# Patient Record
Sex: Female | Born: 1955 | Race: White | Hispanic: No | Marital: Married | Smoking: Current every day smoker
Health system: Southern US, Community
[De-identification: ages and names within clinical notes are randomized; demographics above are authoritative.]

## PROBLEM LIST (undated history)

## (undated) DIAGNOSIS — R569 Unspecified convulsions: Secondary | ICD-10-CM

## (undated) DIAGNOSIS — E119 Type 2 diabetes mellitus without complications: Secondary | ICD-10-CM

## (undated) DIAGNOSIS — I509 Heart failure, unspecified: Secondary | ICD-10-CM

## (undated) DIAGNOSIS — J449 Chronic obstructive pulmonary disease, unspecified: Secondary | ICD-10-CM

## (undated) DIAGNOSIS — F1020 Alcohol dependence, uncomplicated: Secondary | ICD-10-CM

## (undated) HISTORY — PX: APPENDECTOMY: SHX54

## (undated) HISTORY — PX: ABDOMINAL HYSTERECTOMY: SHX81

---

## 2004-03-06 ENCOUNTER — Other Ambulatory Visit: Payer: Self-pay

## 2004-08-18 ENCOUNTER — Emergency Department: Payer: Self-pay | Admitting: Emergency Medicine

## 2004-09-27 ENCOUNTER — Emergency Department: Payer: Self-pay | Admitting: Internal Medicine

## 2006-04-21 ENCOUNTER — Inpatient Hospital Stay: Payer: Self-pay | Admitting: Internal Medicine

## 2007-10-06 ENCOUNTER — Other Ambulatory Visit: Payer: Self-pay

## 2007-10-06 ENCOUNTER — Inpatient Hospital Stay: Payer: Self-pay | Admitting: Unknown Physician Specialty

## 2016-09-20 ENCOUNTER — Encounter (HOSPITAL_COMMUNITY): Payer: Self-pay

## 2016-09-20 ENCOUNTER — Emergency Department (HOSPITAL_COMMUNITY): Payer: Medicaid Other

## 2016-09-20 ENCOUNTER — Emergency Department (HOSPITAL_COMMUNITY)
Admission: EM | Admit: 2016-09-20 | Discharge: 2016-09-20 | Disposition: A | Discharge status: Home / Self Care | Payer: Medicaid Other | Attending: Emergency Medicine | Admitting: Emergency Medicine

## 2016-09-20 DIAGNOSIS — J449 Chronic obstructive pulmonary disease, unspecified: Secondary | ICD-10-CM | POA: Diagnosis not present

## 2016-09-20 DIAGNOSIS — E119 Type 2 diabetes mellitus without complications: Secondary | ICD-10-CM | POA: Diagnosis not present

## 2016-09-20 DIAGNOSIS — Y999 Unspecified external cause status: Secondary | ICD-10-CM | POA: Insufficient documentation

## 2016-09-20 DIAGNOSIS — F1010 Alcohol abuse, uncomplicated: Secondary | ICD-10-CM | POA: Diagnosis not present

## 2016-09-20 DIAGNOSIS — R569 Unspecified convulsions: Secondary | ICD-10-CM | POA: Diagnosis not present

## 2016-09-20 DIAGNOSIS — Z79899 Other long term (current) drug therapy: Secondary | ICD-10-CM | POA: Insufficient documentation

## 2016-09-20 DIAGNOSIS — W2209XA Striking against other stationary object, initial encounter: Secondary | ICD-10-CM | POA: Diagnosis not present

## 2016-09-20 DIAGNOSIS — Y939 Activity, unspecified: Secondary | ICD-10-CM | POA: Diagnosis not present

## 2016-09-20 DIAGNOSIS — M6281 Muscle weakness (generalized): Secondary | ICD-10-CM | POA: Insufficient documentation

## 2016-09-20 DIAGNOSIS — W19XXXA Unspecified fall, initial encounter: Secondary | ICD-10-CM

## 2016-09-20 DIAGNOSIS — F17200 Nicotine dependence, unspecified, uncomplicated: Secondary | ICD-10-CM | POA: Diagnosis not present

## 2016-09-20 DIAGNOSIS — M549 Dorsalgia, unspecified: Secondary | ICD-10-CM

## 2016-09-20 DIAGNOSIS — Y92009 Unspecified place in unspecified non-institutional (private) residence as the place of occurrence of the external cause: Secondary | ICD-10-CM | POA: Insufficient documentation

## 2016-09-20 DIAGNOSIS — M546 Pain in thoracic spine: Secondary | ICD-10-CM | POA: Diagnosis not present

## 2016-09-20 DIAGNOSIS — S0990XA Unspecified injury of head, initial encounter: Secondary | ICD-10-CM | POA: Insufficient documentation

## 2016-09-20 DIAGNOSIS — R29898 Other symptoms and signs involving the musculoskeletal system: Secondary | ICD-10-CM

## 2016-09-20 DIAGNOSIS — I509 Heart failure, unspecified: Secondary | ICD-10-CM | POA: Diagnosis not present

## 2016-09-20 HISTORY — DX: Alcohol dependence, uncomplicated: F10.20

## 2016-09-20 HISTORY — DX: Chronic obstructive pulmonary disease, unspecified: J44.9

## 2016-09-20 HISTORY — DX: Heart failure, unspecified: I50.9

## 2016-09-20 HISTORY — DX: Type 2 diabetes mellitus without complications: E11.9

## 2016-09-20 HISTORY — DX: Unspecified convulsions: R56.9

## 2016-09-20 LAB — URINALYSIS, ROUTINE W REFLEX MICROSCOPIC
BILIRUBIN URINE: NEGATIVE
Glucose, UA: NEGATIVE mg/dL
KETONES UR: NEGATIVE mg/dL
LEUKOCYTES UA: NEGATIVE
NITRITE: NEGATIVE
Protein, ur: NEGATIVE mg/dL
Specific Gravity, Urine: 1.009 (ref 1.005–1.030)
pH: 7 (ref 5.0–8.0)

## 2016-09-20 LAB — COMPREHENSIVE METABOLIC PANEL
ALBUMIN: 3.6 g/dL (ref 3.5–5.0)
ALT: 24 U/L (ref 14–54)
ANION GAP: 13 (ref 5–15)
AST: 36 U/L (ref 15–41)
Alkaline Phosphatase: 81 U/L (ref 38–126)
BUN: <5 mg/dL — ABNORMAL LOW (ref 6–20)
CO2: 25 mmol/L (ref 22–32)
Calcium: 8.2 mg/dL — ABNORMAL LOW (ref 8.9–10.3)
Chloride: 104 mmol/L (ref 101–111)
Creatinine, Ser: 0.66 mg/dL (ref 0.44–1.00)
GFR calc Af Amer: >60 mL/min (ref >60)
GFR calc non Af Amer: >60 mL/min (ref >60)
GLUCOSE: 98 mg/dL (ref 65–99)
POTASSIUM: 3.3 mmol/L — AB (ref 3.5–5.1)
SODIUM: 142 mmol/L (ref 135–145)
Total Bilirubin: 0.4 mg/dL (ref 0.3–1.2)
Total Protein: 6.5 g/dL (ref 6.5–8.1)

## 2016-09-20 LAB — CBC WITH DIFFERENTIAL/PLATELET
BASOS PCT: 2 %
Basophils Absolute: 0.1 10*3/uL (ref 0.0–0.1)
EOS ABS: 0.3 10*3/uL (ref 0.0–0.7)
Eosinophils Relative: 5 %
HCT: 39.5 % (ref 36.0–46.0)
HEMOGLOBIN: 13 g/dL (ref 12.0–15.0)
Lymphocytes Relative: 56 %
Lymphs Abs: 3.6 10*3/uL (ref 0.7–4.0)
MCH: 33.9 pg (ref 26.0–34.0)
MCHC: 32.9 g/dL (ref 30.0–36.0)
MCV: 102.9 fL — ABNORMAL HIGH (ref 78.0–100.0)
MONO ABS: 0.5 10*3/uL (ref 0.1–1.0)
Monocytes Relative: 8 %
NEUTROS PCT: 29 %
Neutro Abs: 1.8 10*3/uL (ref 1.7–7.7)
Platelets: 262 10*3/uL (ref 150–400)
RBC: 3.84 MIL/uL — ABNORMAL LOW (ref 3.87–5.11)
RDW: 14.5 % (ref 11.5–15.5)
WBC: 6.4 10*3/uL (ref 4.0–10.5)

## 2016-09-20 LAB — RAPID URINE DRUG SCREEN, HOSP PERFORMED
Amphetamines: NOT DETECTED
BARBITURATES: NOT DETECTED
Benzodiazepines: NOT DETECTED
Cocaine: POSITIVE — AB
OPIATES: NOT DETECTED
Tetrahydrocannabinol: NOT DETECTED

## 2016-09-20 LAB — ETHANOL: Alcohol, Ethyl (B): 193 mg/dL — ABNORMAL HIGH (ref <5)

## 2016-09-20 MED ORDER — ONDANSETRON HCL 4 MG/2ML IJ SOLN
4.0000 mg | Freq: Once | INTRAMUSCULAR | Status: AC
  Administered 2016-09-20: 4 mg via INTRAVENOUS
  Filled 2016-09-20: qty 2

## 2016-09-20 MED ORDER — MIDAZOLAM HCL 2 MG/2ML IJ SOLN
1.0000 mg | Freq: Once | INTRAMUSCULAR | Status: AC
  Administered 2016-09-20: 1 mg via INTRAVENOUS

## 2016-09-20 MED ORDER — MIDAZOLAM HCL 2 MG/2ML IJ SOLN
INTRAMUSCULAR | Status: AC
  Filled 2016-09-20: qty 2

## 2016-09-20 MED ORDER — FENTANYL CITRATE (PF) 100 MCG/2ML IJ SOLN: 50.0000 ug | INTRAMUSCULAR | Status: DC | PRN

## 2016-09-20 MED ORDER — PHENYTOIN SODIUM EXTENDED 100 MG PO CAPS: 300.0000 mg | ORAL_CAPSULE | Freq: Once | ORAL | Status: DC

## 2016-09-20 MED ORDER — SODIUM CHLORIDE 0.9 % IV SOLN
250.0000 mg | Freq: Once | INTRAVENOUS | Status: AC
  Administered 2016-09-20: 250 mg via INTRAVENOUS
  Filled 2016-09-20: qty 5

## 2016-09-20 MED ORDER — SODIUM CHLORIDE 0.9 % IV SOLN
INTRAVENOUS | Status: DC
  Administered 2016-09-20: 05:00:00 via INTRAVENOUS

## 2016-09-20 MED ORDER — ONDANSETRON HCL 4 MG/2ML IJ SOLN: 4.0000 mg | Freq: Four times a day (QID) | INTRAMUSCULAR | Status: DC | PRN

## 2016-09-20 MED ORDER — FENTANYL CITRATE (PF) 100 MCG/2ML IJ SOLN
50.0000 ug | Freq: Once | INTRAMUSCULAR | Status: AC
  Administered 2016-09-20: 50 ug via INTRAVENOUS
  Filled 2016-09-20: qty 2

## 2016-09-20 MED ORDER — LORAZEPAM 2 MG/ML IJ SOLN
1.0000 mg | Freq: Once | INTRAMUSCULAR | Status: AC
  Administered 2016-09-20: 1 mg via INTRAVENOUS
  Filled 2016-09-20: qty 1

## 2016-09-20 NOTE — ED Provider Notes (Signed)
TIME SEEN: 4:40 AM  CHIEF COMPLAINT: fall, back pain, bilateral lower extremity weakness   HPI: Patient is a 61 year old female with history of COPD on chronic oxygen, seizures no longer on Dilantin, alcohol abuse, diabetes who presents emergency department [facility] for bilateral lower extremity weakness, numbness after a fall. Patient reports she was wringing alcohol the night and states "I drank way too much". She reports that someone told her that she had a seizure at home and struck her head on the door. Has history of tonic-clonic seizures not in the setting of withdrawal and states that she has not had her Dilantin in over a year because she has not had a seizure in over one year. Is complaining of neck, thoracic and lumbar spine pain. Reports she is wheelchair-bound at baseline but is because of chronic shortness of breath. 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 Normally is able to move her legs and feel her legs without difficulty. Before this seizure, states she was feeling well without complaints. At outside hospital, patient found to have alcohol level of 350. Had a CT of her head, cervical spine, thoracic spine and lumbar spine showed no acute abnormality. Sent here for MRI. Outside hospital reported decreased rectal tone.  ROS: See HPI Constitutional: no fever  Eyes: no drainage  ENT: no runny nose   Cardiovascular:  no chest pain  Resp: no SOB  GI: no vomiting GU: no dysuria Integumentary: no rash  Allergy: no hives  Musculoskeletal: no leg swelling  Neurological: no slurred speech ROS otherwise negative  PAST MEDICAL HISTORY/PAST SURGICAL HISTORY:  Past Medical History:  Diagnosis Date  . Alcoholism (HCC)   . CHF (congestive heart failure) (HCC)   . COPD (chronic obstructive pulmonary disease) (HCC)   . Diabetes mellitus without complication (HCC)   . Seizures (HCC)      MEDICATIONS:  Prior to Admission medications   Not on File    ALLERGIES:  Allergies  Allergen Reactions  . Demerol [Meperidine] Anaphylaxis  . Ivp Dye [Iodinated Diagnostic Agents] Anaphylaxis  . Toradol [Ketorolac Tromethamine] Other (See Comments)    "Makes me have seizures"   . Tramadol Other (See Comments)    "Makes me have seizures"   . Codeine Rash  . Penicillins Rash    SOCIAL HISTORY:  Social History  Substance Use Topics  . Smoking status: Current Every Day Smoker  . Smokeless tobacco: Never Used  . Alcohol use Yes     Comment: daily    FAMILY HISTORY: No family history on file.  EXAM: BP 153/79   Pulse 82   Temp 98.5 F (36.9 C) (Oral)   Resp 18   Ht 5\' 2"  (1.575 m)   Wt 200 lb (90.7 kg)   SpO2 98%   BMI 36.58 kg/m  CONSTITUTIONAL: Alert and oriented and responds appropriately to questions.  Obese, does not appear significantly intoxicated, in no distress, chronically ill-appearing HEAD: Normocephalic, atraumatic EYES: Conjunctivae mildly injected bilaterally, PERRL, EOMI ENT: normal nose; no rhinorrhea; moist mucous membranes, no dental injury, no tenderness over the face NECK: Supple, no meningismus, no nuchal rigidity, no LAD, patient does have diffuse cervical spine tenderness without step-off or deformity, an Aspen collar  CARD: RRR; S1 and S2 appreciated; no murmurs, no clicks, no rubs, no gallops CHEST:  Chest wall nontender to palpation without crepitus, ecchymosis or deformity RESP: Normal chest excursion without splinting or tachypnea; breath sounds clear  and equal bilaterally; no wheezes, no rhonchi, no rales, no hypoxia or respiratory distress, speaking full sentences ABD/GI: Normal bowel sounds; non-distended; soft, non-tender, no rebound, no guarding, no peritoneal signs, no hepatosplenomegaly BACK:  The back appears normal but is tender over the mid thoracic region and upper lumbar region with point tenderness over T7, T8, L1, L2  without step-off or deformity EXT:  non-tender to palpation; no edema; normal capillary refill; no cyanosis, no calf tenderness or swelling; no bony deformity 2+ radial DPs pulses bilaterally, compartment soft, no joint effusion SKIN: Normal color for age and race; warm; no rash NEURO:  Patient is able to move her bilateral upper extreme is normally with 5/5 strength. She has normal reflexes in the bilateral upper extremities with normal sensation throughout her torso, upper shoulders and face. Cranial nerves II through XII intact. Patient has absolutely no movement and no sensation in the bilateral lower extremity from the hip down. Completely diminished reflexes in bilateral lower extremities. Does not have a sensation the pain. Of the lower extremities. PSYCH: The patient's mood and manner are appropriate. Grooming and personal hygiene are appropriate.  MEDICAL DECISION MAKING: Patient here with concerns for bilateral lower extremity weakness and numbness after a seizure, head injury that occurred yesterday evening. Outside hospital CT imaging showed no acute abnormality. Sent here for MRI. Here patient exam is very concerning where she has decreased sensation to light stimulus and even painful stimuli to her bilateral lower extremities with absolutely no movement. There is stool on her bed sheets and outside hospital reported decreased rectal tone. Doubt this is a CVA given his bilateral and she is mentating normally. She does not appear significantly intoxicated at this time. We'll repeat her labs today including an ethanol level. We'll discuss with neurology but I feel she will need emergent MRIs of her lumbar and thoracic spine and possibly of her cervical spine as well.  ED PROGRESS:   5:00 AM  D/w Dr. Amada Jupiter with neurology.  Appreciate his help. He agrees that patient needs MRIs of her cervical, thoracic and lumbar spine without contrast. He recommends starting patient back on Dilantin 300  mg daily at bedtime. We'll give first dose in the emergency department.   6:45 AM  Pt going to MRI. We have placed a Foley catheter given at this time she is on spine precautions and I do not want her on a bedpan. Still in a cervical collar. We'll give Ativan to help her with her anxiety for the MRI. She is NPO and receiving gentle IV hydration. Patient will be signed out to oncoming provider to follow up on MRI results.   I reviewed all nursing notes, vitals, pertinent old records, EKGs, labs, imaging (as available).    Layla Maw Ward, DO 09/20/16 [phone]

## 2016-09-20 NOTE — ED Triage Notes (Addendum)
PT arrives via ems [facility] for MRI and evaluation of inability to feel or move bilateral lower extremities. PT was at home tonight when she had unwitnessed seizure and fell, hitting head on door. PT complains of 10/10 thoracic lumbar pain but states she cant feel her legs. ETOH 350. Pt arrives to ed sitting up on stretcher with C-collar in place but reports prior hospital [facility]) removed collar and allowed her to sit up and drink water.

## 2016-09-20 NOTE — ED Notes (Signed)
[facility] reports pt had no rectal tone on exam

## 2016-09-20 NOTE — ED Notes (Signed)
Patient transported to MRI 

## 2016-09-20 NOTE — ED Notes (Signed)
Pt collar changed to Aspen collar and HOB flat. Pt tolerating well but complaining of back pain. Dr. Elesa MassedWard made aware

## 2016-09-20 NOTE — ED Provider Notes (Signed)
Mr Cervical Spine Wo Contrast  Result Date: 09/20/2016 CLINICAL DATA:  Bilateral lower extremity weakness and decreased rectal tone after a fall. Cervical, thoracic and lumbar spine pain. EXAM: MRI CERVICAL, THORACIC AND LUMBAR SPINE WITHOUT CONTRAST TECHNIQUE: Multiplanar and multiecho pulse sequences of the cervical spine, to include the craniocervical junction and cervicothoracic junction, and thoracic and lumbar spine, were obtained without intravenous contrast. COMPARISON:  None. FINDINGS: MRI CERVICAL SPINE FINDINGS Alignment: Physiologic. Vertebrae: No fracture, evidence of discitis, or bone lesion. Cord: Normal signal and morphology. Motion degrades image quality and sensitivity. Paraspinal and other soft tissues: Negative. Disc levels: Craniocervical junction through C4-5: Negative. C5-6: Tiny broad-based disc osteophyte complex without focal neural impingement. C6-7 through C7-T1: No significant abnormality. MRI THORACIC SPINE FINDINGS Alignment:  Physiologic. Vertebrae: No fracture, evidence of discitis, or significant bone lesion. Focal fat in the inferior aspect of T3, not significant. Cord:  Normal signal and morphology. Paraspinal and other soft tissues: Negative. Disc levels: No disc bulging or protrusion or other abnormality of the discs. MRI LUMBAR SPINE FINDINGS Segmentation:  Standard. Alignment:  Physiologic. Vertebrae:  No fracture, evidence of discitis, or bone lesion. Conus medullaris: Extends to the T12-L1 level and appears normal. Paraspinal and other soft tissues: Negative. Disc levels: L1-2 and L2-3: Slight disc desiccation. No disc bulging or protrusion. L3-4: Slight disc desiccation. Small right extraforaminal disc bulge touches the right L3 nerve lateral to the neural foramen. Slight hypertrophy of the facet joints. L4-5: Minimal disc bulges into the neural foramina. Moderate hypertrophy of the ligamentum flavum. No neural impingement. L5-S1: Normal disc. Minimal degenerative changes  of the facet joints. IMPRESSION: 1. No significant abnormality of the cervical spine. 2. No significant abnormality of the thoracic spine. 3. No significant abnormality of the lumbar spine. Electronically Signed   By: Francene Boyers M.D.   On: 09/20/2016 09:06   Mr Thoracic Spine Wo Contrast  Result Date: 09/20/2016 CLINICAL DATA:  Bilateral lower extremity weakness and decreased rectal tone after a fall. Cervical, thoracic and lumbar spine pain. EXAM: MRI CERVICAL, THORACIC AND LUMBAR SPINE WITHOUT CONTRAST TECHNIQUE: Multiplanar and multiecho pulse sequences of the cervical spine, to include the craniocervical junction and cervicothoracic junction, and thoracic and lumbar spine, were obtained without intravenous contrast. COMPARISON:  None. FINDINGS: MRI CERVICAL SPINE FINDINGS Alignment: Physiologic. Vertebrae: No fracture, evidence of discitis, or bone lesion. Cord: Normal signal and morphology. Motion degrades image quality and sensitivity. Paraspinal and other soft tissues: Negative. Disc levels: Craniocervical junction through C4-5: Negative. C5-6: Tiny broad-based disc osteophyte complex without focal neural impingement. C6-7 through C7-T1: No significant abnormality. MRI THORACIC SPINE FINDINGS Alignment:  Physiologic. Vertebrae: No fracture, evidence of discitis, or significant bone lesion. Focal fat in the inferior aspect of T3, not significant. Cord:  Normal signal and morphology. Paraspinal and other soft tissues: Negative. Disc levels: No disc bulging or protrusion or other abnormality of the discs. MRI LUMBAR SPINE FINDINGS Segmentation:  Standard. Alignment:  Physiologic. Vertebrae:  No fracture, evidence of discitis, or bone lesion. Conus medullaris: Extends to the T12-L1 level and appears normal. Paraspinal and other soft tissues: Negative. Disc levels: L1-2 and L2-3: Slight disc desiccation. No disc bulging or protrusion. L3-4: Slight disc desiccation. Small right extraforaminal disc bulge  touches the right L3 nerve lateral to the neural foramen. Slight hypertrophy of the facet joints. L4-5: Minimal disc bulges into the neural foramina. Moderate hypertrophy of the ligamentum flavum. No neural impingement. L5-S1: Normal disc. Minimal degenerative changes of the facet joints. IMPRESSION:  1. No significant abnormality of the cervical spine. 2. No significant abnormality of the thoracic spine. 3. No significant abnormality of the lumbar spine. Electronically Signed   By: Francene BoyersJames  Maxwell M.D.   On: 09/20/2016 09:06   Mr Lumbar Spine Wo Contrast  Result Date: 09/20/2016 CLINICAL DATA:  Bilateral lower extremity weakness and decreased rectal tone after a fall. Cervical, thoracic and lumbar spine pain. EXAM: MRI CERVICAL, THORACIC AND LUMBAR SPINE WITHOUT CONTRAST TECHNIQUE: Multiplanar and multiecho pulse sequences of the cervical spine, to include the craniocervical junction and cervicothoracic junction, and thoracic and lumbar spine, were obtained without intravenous contrast. COMPARISON:  None. FINDINGS: MRI CERVICAL SPINE FINDINGS Alignment: Physiologic. Vertebrae: No fracture, evidence of discitis, or bone lesion. Cord: Normal signal and morphology. Motion degrades image quality and sensitivity. Paraspinal and other soft tissues: Negative. Disc levels: Craniocervical junction through C4-5: Negative. C5-6: Tiny broad-based disc osteophyte complex without focal neural impingement. C6-7 through C7-T1: No significant abnormality. MRI THORACIC SPINE FINDINGS Alignment:  Physiologic. Vertebrae: No fracture, evidence of discitis, or significant bone lesion. Focal fat in the inferior aspect of T3, not significant. Cord:  Normal signal and morphology. Paraspinal and other soft tissues: Negative. Disc levels: No disc bulging or protrusion or other abnormality of the discs. MRI LUMBAR SPINE FINDINGS Segmentation:  Standard. Alignment:  Physiologic. Vertebrae:  No fracture, evidence of discitis, or bone lesion.  Conus medullaris: Extends to the T12-L1 level and appears normal. Paraspinal and other soft tissues: Negative. Disc levels: L1-2 and L2-3: Slight disc desiccation. No disc bulging or protrusion. L3-4: Slight disc desiccation. Small right extraforaminal disc bulge touches the right L3 nerve lateral to the neural foramen. Slight hypertrophy of the facet joints. L4-5: Minimal disc bulges into the neural foramina. Moderate hypertrophy of the ligamentum flavum. No neural impingement. L5-S1: Normal disc. Minimal degenerative changes of the facet joints. IMPRESSION: 1. No significant abnormality of the cervical spine. 2. No significant abnormality of the thoracic spine. 3. No significant abnormality of the lumbar spine. Electronically Signed   By: Francene BoyersJames  Maxwell M.D.   On: 09/20/2016 09:06     MRI complete spine without abnormality.  Patient feels much better at this time.  She is ambulatory in the emergency department.  Discharge home in good condition. ETOH intoxication   Azalia BilisKevin Oleg Oleson, MD 09/20/16 1052

## 2018-02-11 IMAGING — MR MR LUMBAR SPINE W/O CM
13 of 17 series · 37 of 48 positions shown · non-contrast
Comparison: None.

CLINICAL DATA: Bilateral lower extremity weakness and decreased
rectal tone after a fall. Cervical, thoracic and lumbar spine pain.

EXAM:
MRI CERVICAL, THORACIC AND LUMBAR SPINE WITHOUT CONTRAST
TECHNIQUE: Multiplanar and multiecho pulse sequences of the cervical spine, to
include the craniocervical junction and cervicothoracic junction,
and thoracic and lumbar spine, were obtained without intravenous
contrast.

[Series 2: T2 · sagittal · 3.0mm · 0.43mm/px · 1 of 14 slices shown (1 of 7)]
[im 1/14]
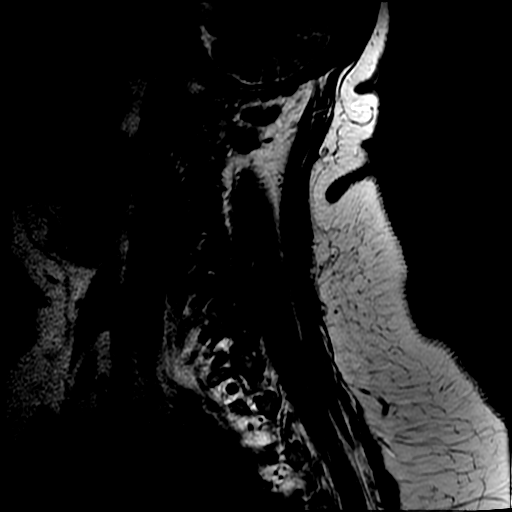

[Series 3: STIR · sagittal · 3.0mm · 0.43mm/px · 1 of 14 slices shown]
[im 1/14]
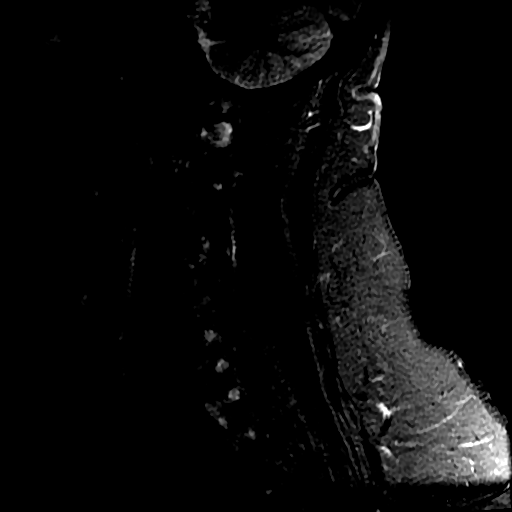

[Series 7: T2 · axial · 3.0mm · 0.70mm/px · z∈[-48,+56]mm · 5 of 33 slices shown (2 of 7)]
[im 1/33]
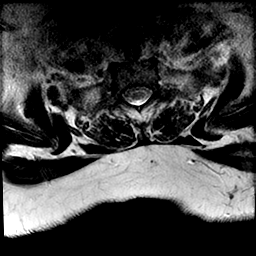
[im 9/33]
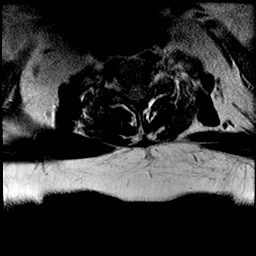
[im 17/33]
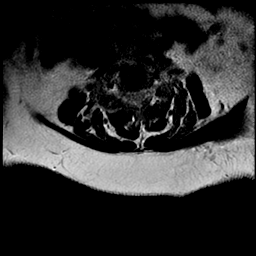
[im 25/33]
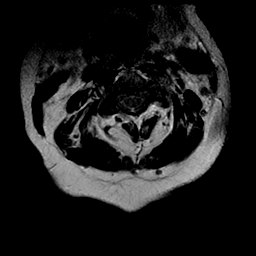
[im 33/33]
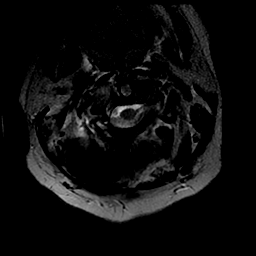

[Series 10: T1 · sagittal · 3.0mm · 0.43mm/px · 2 of 14 slices shown (1 of 5)]
[im 1/14]
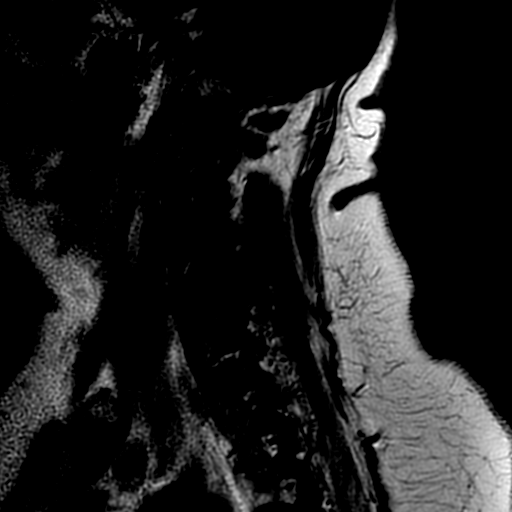
[im 14/14]
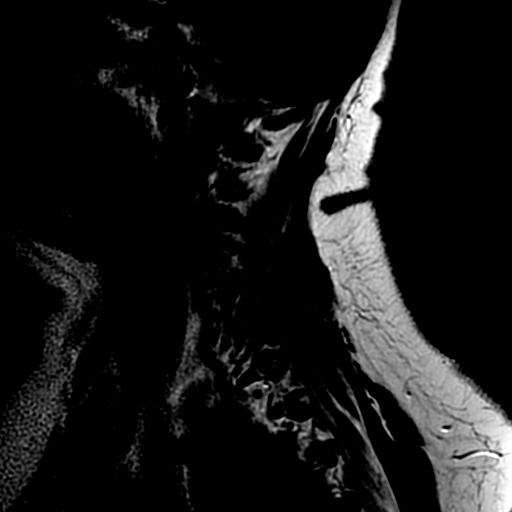

[Series 12: T1 · sagittal · 3.0mm · 0.90mm/px · 2 of 12 slices shown (2 of 5)]
[im 1/12]
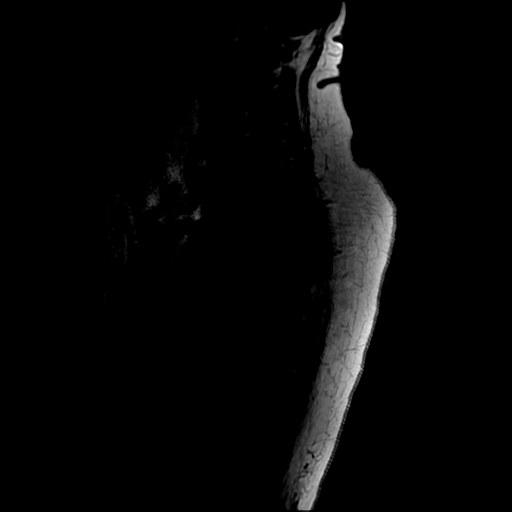
[im 12/12]
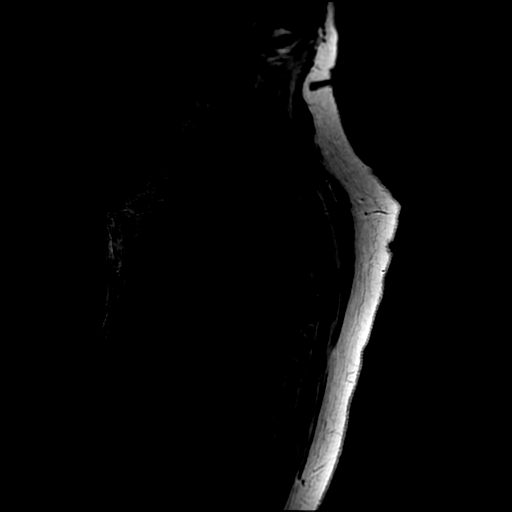

[Series 13: T2 · sagittal · 3.0mm · 0.66mm/px · 2 of 12 slices shown (3 of 7)]
[im 1/12]
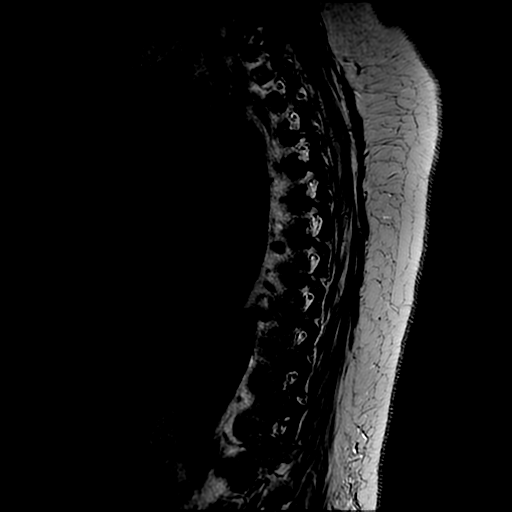
[im 12/12]
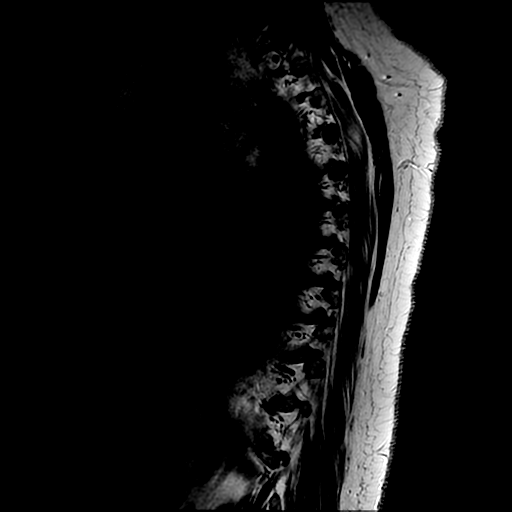

[Series 15: T1 · sagittal · 3.0mm · 0.66mm/px · 2 of 12 slices shown (3 of 5)]
[im 1/12]
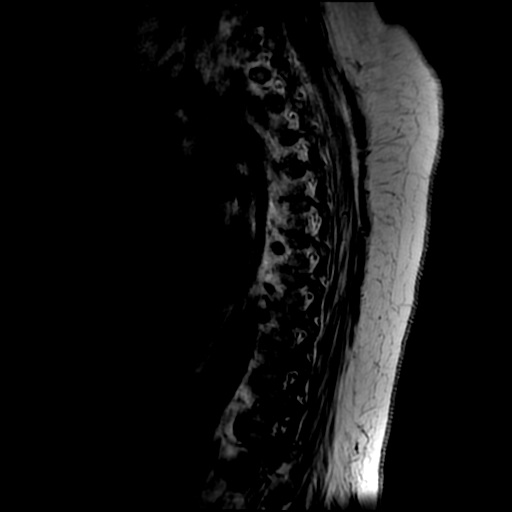
[im 12/12]
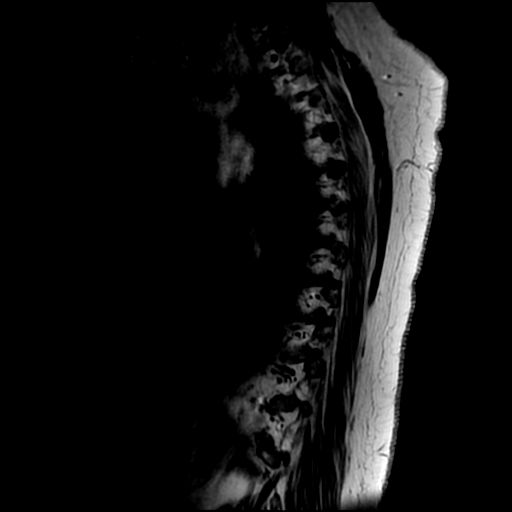

[Series 16: T2 · axial · 4.0mm · 0.78mm/px · z∈[-175,-24]mm · 4 of 29 slices shown (4 of 7)]
[im 1/29]
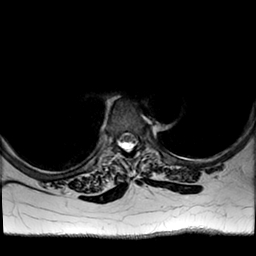
[im 10/29]
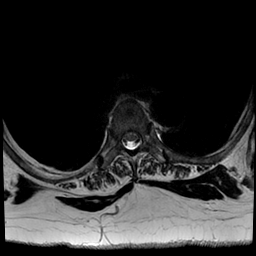
[im 19/29]
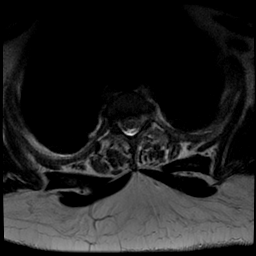
[im 29/29]
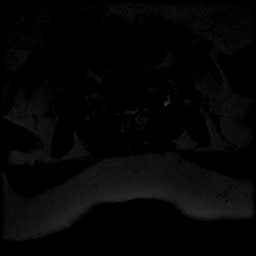

[Series 17: T2 · axial · 4.0mm · 0.78mm/px · z∈[-315,-167]mm · 4 of 28 slices shown (5 of 7)]
[im 1/28]
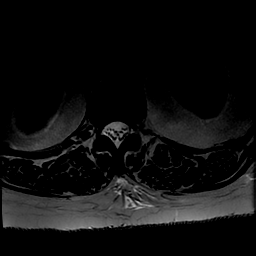
[im 10/28]
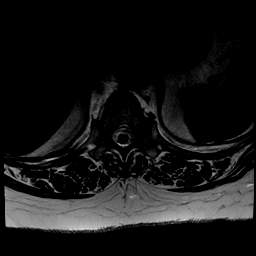
[im 19/28]
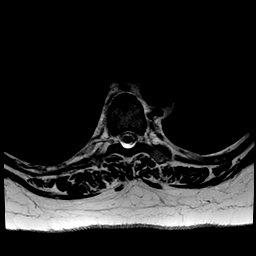
[im 28/28]
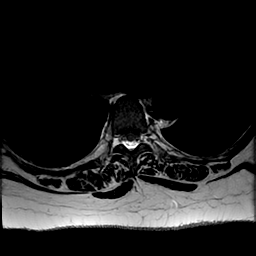

[Series 19: T2 · sagittal · 4.0mm · 1.09mm/px · 2 of 12 slices shown (6 of 7)]
[im 1/12]
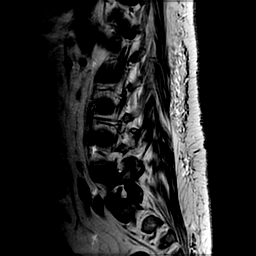
[im 12/12]
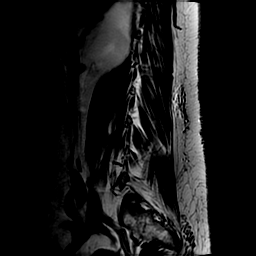

[Series 20: T1 · sagittal · 4.0mm · 1.09mm/px · 2 of 12 slices shown (4 of 5)]
[im 1/12]
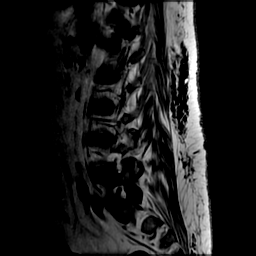
[im 12/12]
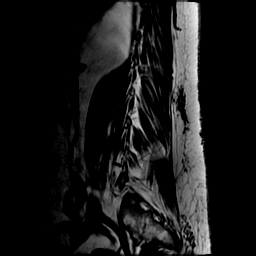

[Series 22: T2 · axial · 4.0mm · 0.78mm/px · z∈[-490,-320]mm · 5 of 35 slices shown (7 of 7)]
[im 1/35]
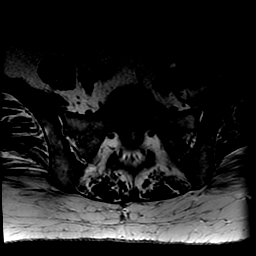
[im 9/35]
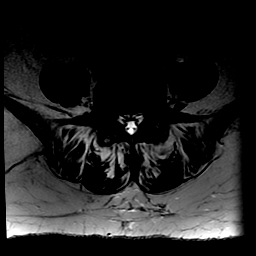
[im 18/35]
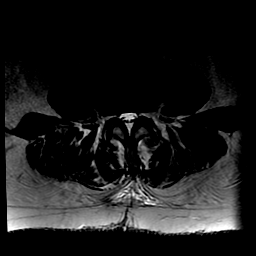
[im 26/35]
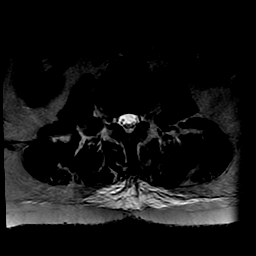
[im 35/35]
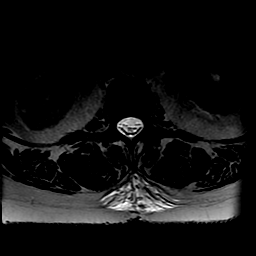

[Series 23: T1 · axial · 4.0mm · 0.78mm/px · z∈[-490,-320]mm · 5 of 35 slices shown (5 of 5)]
[im 1/35]
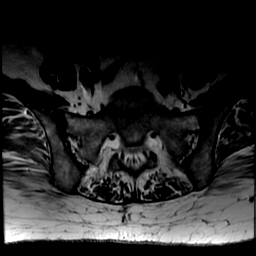
[im 9/35]
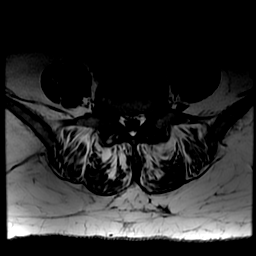
[im 18/35]
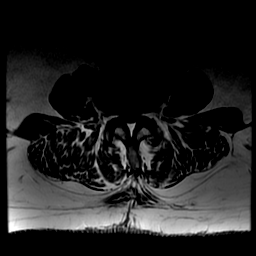
[im 26/35]
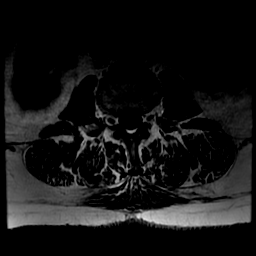
[im 35/35]
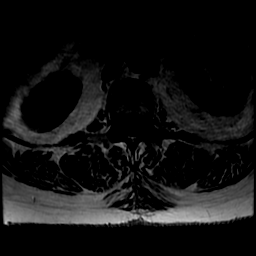

[37 of 48 positions shown; findings below may reference images not displayed]

FINDINGS: MRI CERVICAL SPINE FINDINGS

Alignment: Physiologic.

Vertebrae: No fracture, evidence of discitis, or bone lesion.

Cord: Normal signal and morphology. Motion degrades image quality
and sensitivity.

Paraspinal and other soft tissues: Negative.

Disc levels:

Craniocervical junction through C4-5: Negative.

C5-6: Tiny broad-based disc osteophyte complex without focal neural
impingement.

C6-7 through C7-T1: No significant abnormality.

MRI THORACIC SPINE FINDINGS

Alignment:  Physiologic.

Vertebrae: No fracture, evidence of discitis, or significant bone
lesion. Focal fat in the inferior aspect of T3, not significant.

Cord:  Normal signal and morphology.

Paraspinal and other soft tissues: Negative.

Disc levels:

No disc bulging or protrusion or other abnormality of the discs.

MRI LUMBAR SPINE FINDINGS

Segmentation:  Standard.

Alignment:  Physiologic.

Vertebrae:  No fracture, evidence of discitis, or bone lesion.

Conus medullaris: Extends to the T12-L1 level and appears normal.

Paraspinal and other soft tissues: Negative.

Disc levels:

L1-2 and L2-3: Slight disc desiccation. No disc bulging or
protrusion.

L3-4: Slight disc desiccation. Small right extraforaminal disc bulge
touches the right L3 nerve lateral to the neural foramen. Slight
hypertrophy of the facet joints.

L4-5: Minimal disc bulges into the neural foramina. Moderate
hypertrophy of the ligamentum flavum. No neural impingement.

L5-S1: Normal disc. Minimal degenerative changes of the facet
joints.
IMPRESSION: 1. No significant abnormality of the cervical spine.
2. No significant abnormality of the thoracic spine.
3. No significant abnormality of the lumbar spine.

## 2023-09-09 DEATH — deceased
